# Patient Record
Sex: Female | Born: 2000 | Race: White | Hispanic: No | Marital: Single | Smoking: Never smoker
Health system: Southern US, Community
[De-identification: ages and names within clinical notes are randomized; demographics above are authoritative.]

## PROBLEM LIST (undated history)

## (undated) DIAGNOSIS — F32A Depression, unspecified: Secondary | ICD-10-CM

## (undated) DIAGNOSIS — D649 Anemia, unspecified: Secondary | ICD-10-CM

---
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
## 2021-05-10 IMAGING — MR MR HEAD WO/W CM
16 series · 44 of 48 positions shown · IV contrast (gadavist)
Comparison: None.

CLINICAL DATA: Seizure

EXAM:
MRI HEAD WITHOUT AND WITH CONTRAST
TECHNIQUE: Multiplanar, multiecho pulse sequences of the brain and surrounding
structures were obtained without and with intravenous contrast.
CONTRAST:  7mL GADAVIST GADOBUTROL 1 MMOL/ML IV SOLN

[Series 5: T1 · sagittal · 5.0mm · 0.62mm/px · 1 of 25 slices shown (1 of 2)]
[im 1/25]
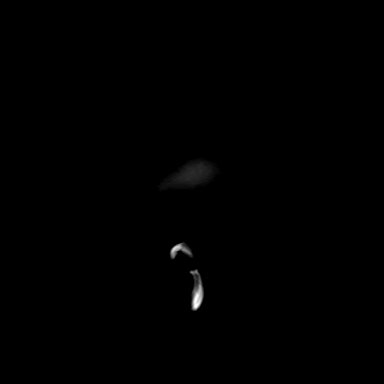

[Series 6: ax dwi_tracew · axial · 3.0mm · 0.60mm/px · z∈[-98,+54]mm · 2 of 48 slices shown]
[im 1/48]
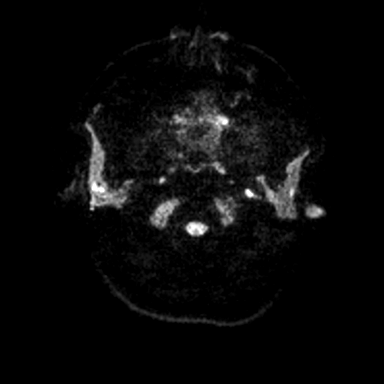
[im 48/48]
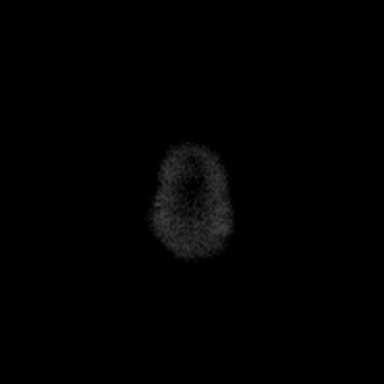

[Series 7: ax dwi_adc · axial · 3.0mm · 0.60mm/px · z∈[-98,+54]mm · 2 of 48 slices shown]
[im 1/48]
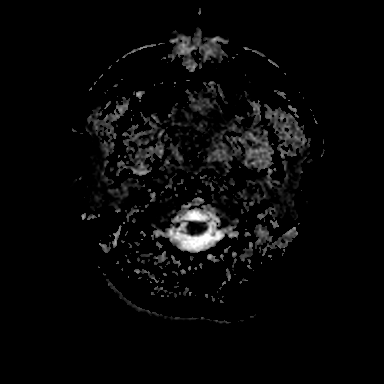
[im 48/48]
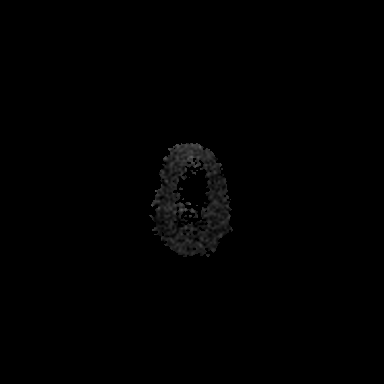

[Series 8: cor dwi_tracew · coronal · 5.0mm · 0.60mm/px · 2 of 40 slices shown]
[im 1/40]
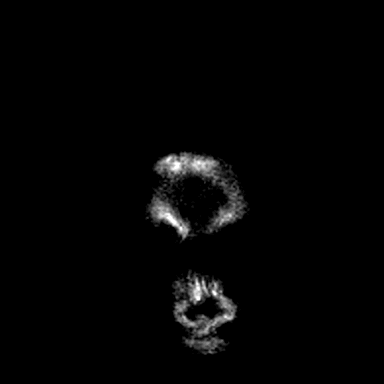
[im 40/40]
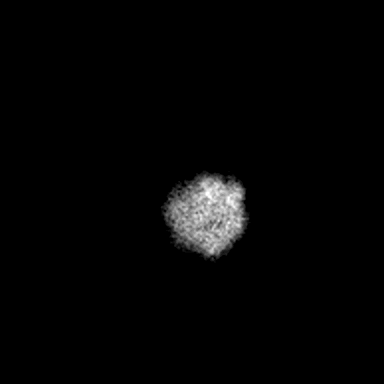

[Series 9: cor dwi_adc · coronal · 5.0mm · 0.60mm/px · 2 of 40 slices shown]
[im 1/40]
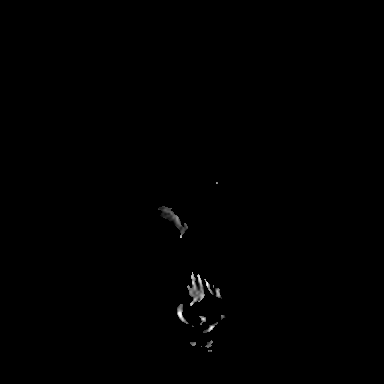
[im 40/40]
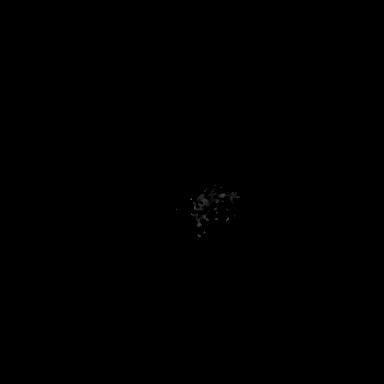

[Series 10: T2 · axial · 5.0mm · 0.53mm/px · 1 of 25 slices shown (1 of 2)]
[im 1/25]
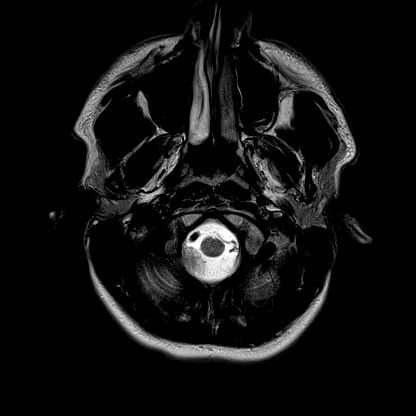

[Series 12: pha_images · axial · 3.0mm · 0.90mm/px · z∈[-109,+59]mm · 3 of 58 slices shown]
[im 1/58]
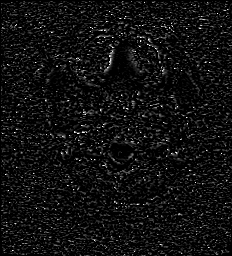
[im 29/58]
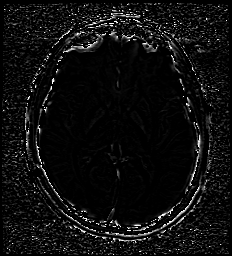
[im 58/58]
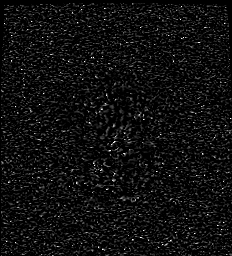

[Series 13: swi_images · axial · 3.0mm · 0.90mm/px · z∈[-109,+65]mm · 3 of 60 slices shown]
[im 1/60]
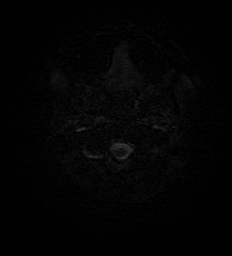
[im 30/60]
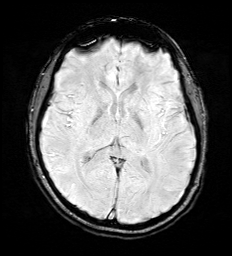
[im 60/60]
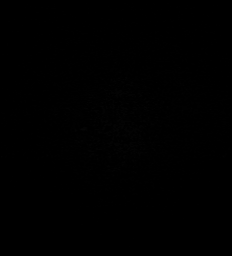

[Series 15: FLAIR · axial · 3.0mm · 0.53mm/px · z∈[-101,+58]mm · 3 of 55 slices shown (1 of 2)]
[im 1/55]
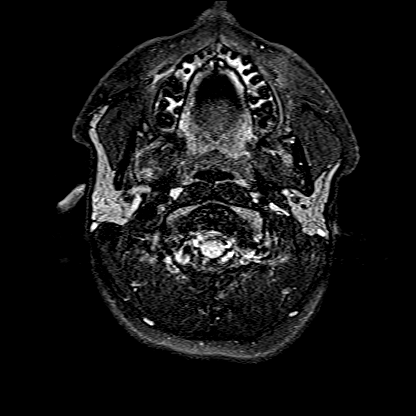
[im 28/55]
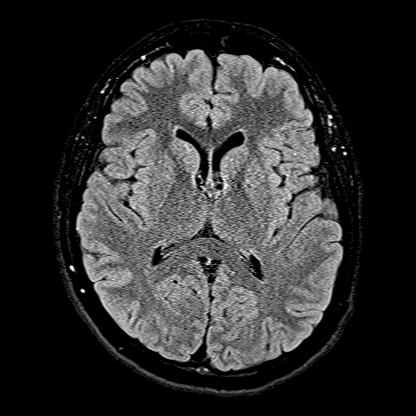
[im 55/55]
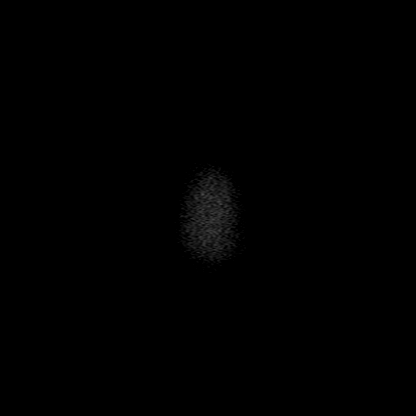

[Series 16: T1 · axial · 1.0mm · 0.98mm/px · z∈[-110,+62]mm · 8 of 175 slices shown (2 of 2)]
[im 1/175]
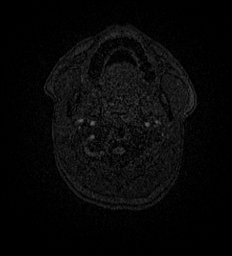
[im 22/175]
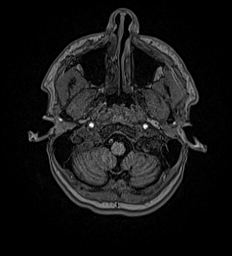
[im 44/175]
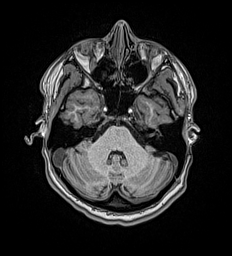
[im 66/175]
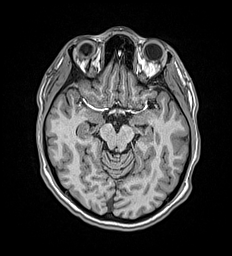
[im 109/175]
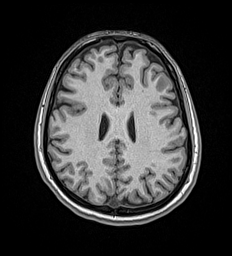
[im 131/175]
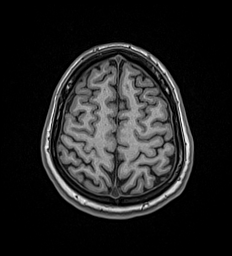
[im 153/175]
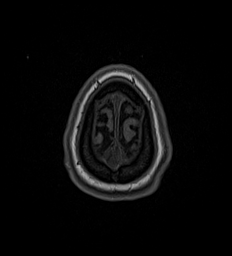
[im 175/175]
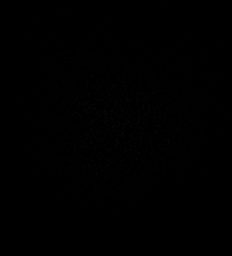

[Series 17: T2 · coronal · 3.0mm · 0.47mm/px · 2 of 35 slices shown (2 of 2)]
[im 1/35]
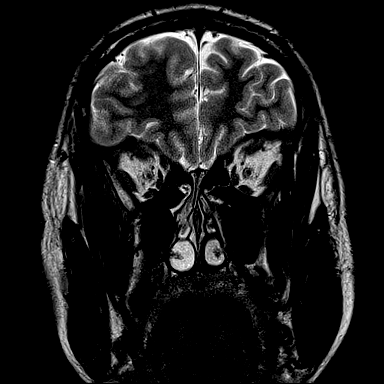
[im 35/35]
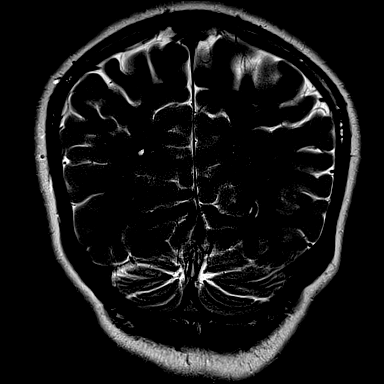

[Series 18: FLAIR · coronal · 3.0mm · 0.47mm/px · 2 of 35 slices shown (2 of 2)]
[im 1/35]
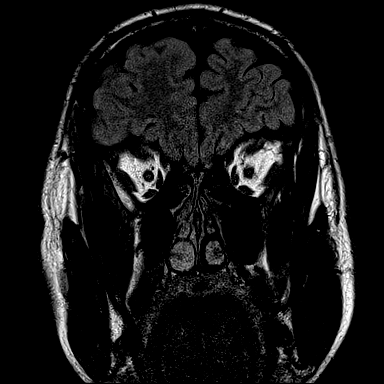
[im 35/35]
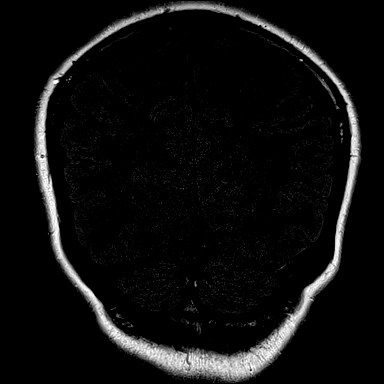

[Series 19: T1 post-contrast · axial · 1.0mm · 0.98mm/px · z∈[-110,+62]mm · 9 of 176 slices shown (1 of 3)]
[im 1/176]
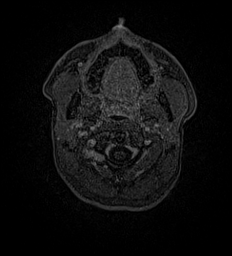
[im 22/176]
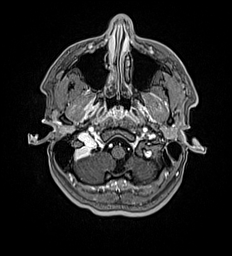
[im 44/176]
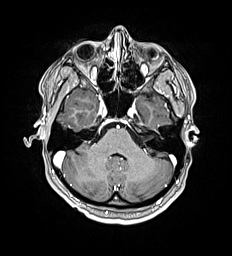
[im 66/176]
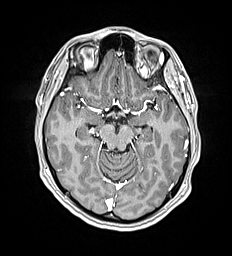
[im 88/176]
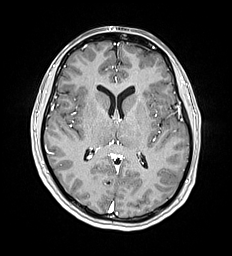
[im 110/176]
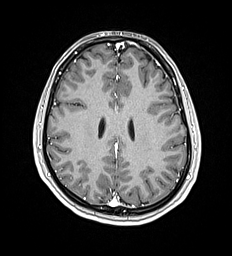
[im 132/176]
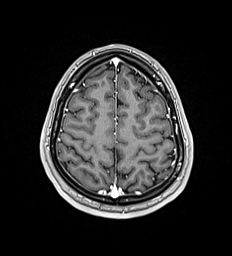
[im 154/176]
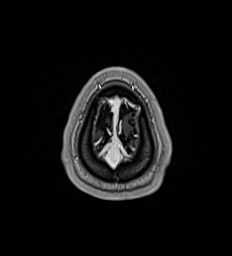
[im 176/176]
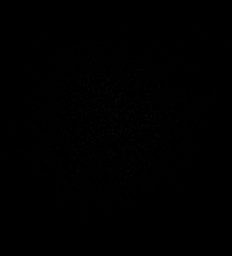

[Series 20: T1 post-contrast · coronal · 5.0mm · 0.57mm/px · 1 of 29 slices shown (2 of 3)]
[im 1/29]
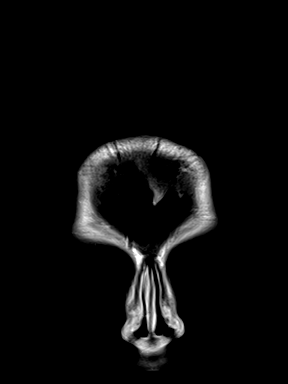

[Series 21: T1 post-contrast · coronal · 5.0mm · 0.57mm/px · 1 of 29 slices shown (3 of 3)]
[im 1/29]
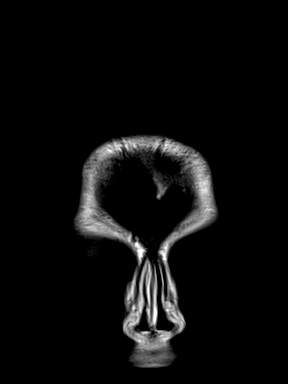

[Series 1023: cor thins rl · coronal · 3.0mm · 0.49mm/px · 2 of 107 slices shown]
[im 1/107]
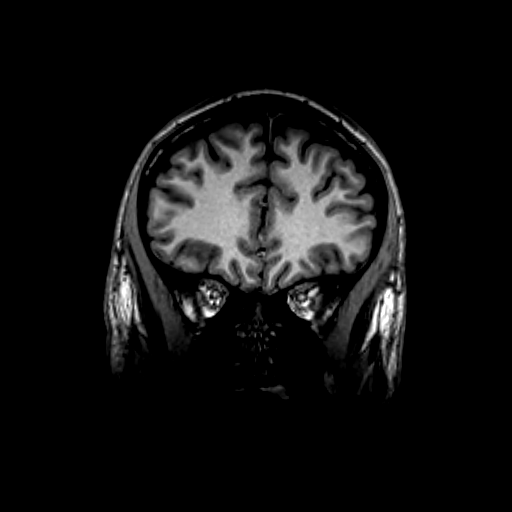
[im 27/107]
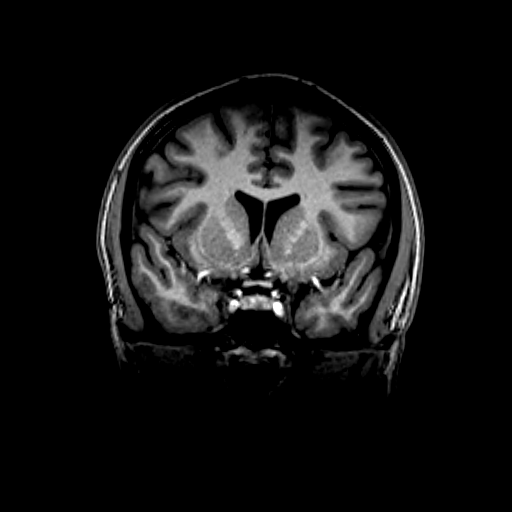

[44 of 48 positions shown; findings below may reference images not displayed]

FINDINGS: Brain: No acute infarct, acute hemorrhage or extra-axial collection.
Normal white matter signal. Normal volume of CSF spaces. No chronic
microhemorrhage. Normal midline structures. There is no abnormal
contrast enhancement.

Vascular: Normal flow voids.

Skull and upper cervical spine: Normal marrow signal.

Sinuses/Orbits: Negative.

Other: None.
IMPRESSION: Normal MRI of the brain.
